# Patient Record
Sex: Male | Born: 1960 | Race: White | Hispanic: No | Marital: Married | Smoking: Current every day smoker
Health system: Southern US, Community
[De-identification: ages and names within clinical notes are randomized; demographics above are authoritative.]

## PROBLEM LIST (undated history)

## (undated) DIAGNOSIS — E785 Hyperlipidemia, unspecified: Secondary | ICD-10-CM

## (undated) DIAGNOSIS — I10 Essential (primary) hypertension: Secondary | ICD-10-CM

---

## 2010-07-02 ENCOUNTER — Emergency Department (HOSPITAL_COMMUNITY): Admission: EM | Admit: 2010-07-02 | Discharge: 2010-07-02 | Payer: Self-pay | Admitting: Emergency Medicine

## 2010-10-28 LAB — BASIC METABOLIC PANEL
Chloride: 104 mEq/L (ref 96–112)
GFR calc non Af Amer: >60 mL/min (ref >60)
Glucose, Bld: 98 mg/dL (ref 70–99)
Potassium: 3.8 mEq/L (ref 3.5–5.1)
Sodium: 137 mEq/L (ref 135–145)

## 2010-10-28 LAB — DIFFERENTIAL
Band Neutrophils: 0 % (ref 0–10)
Basophils Absolute: 0 10*3/uL (ref 0.0–0.1)
Basophils Relative: 0 % (ref 0–1)
Lymphocytes Relative: 40 % (ref 12–46)
Lymphs Abs: 4.5 10*3/uL — ABNORMAL HIGH (ref 0.7–4.0)
Monocytes Absolute: 0.8 10*3/uL (ref 0.1–1.0)
Monocytes Relative: 7 % (ref 3–12)

## 2010-10-28 LAB — CBC
HCT: 47.4 % (ref 39.0–52.0)
Hemoglobin: 16 g/dL (ref 13.0–17.0)
MCHC: 33.8 g/dL (ref 30.0–36.0)
MCV: 92.8 fL (ref 78.0–100.0)
WBC: 11.3 10*3/uL — ABNORMAL HIGH (ref 4.0–10.5)

## 2010-10-28 LAB — POCT CARDIAC MARKERS

## 2015-10-05 ENCOUNTER — Encounter (HOSPITAL_COMMUNITY): Payer: Self-pay

## 2015-10-05 ENCOUNTER — Emergency Department (HOSPITAL_COMMUNITY)
Admission: EM | Admit: 2015-10-05 | Discharge: 2015-10-05 | Disposition: A | Discharge status: Home / Self Care | Payer: Self-pay | Attending: Emergency Medicine | Admitting: Emergency Medicine

## 2015-10-05 ENCOUNTER — Emergency Department (HOSPITAL_COMMUNITY): Payer: Self-pay

## 2015-10-05 DIAGNOSIS — Z87891 Personal history of nicotine dependence: Secondary | ICD-10-CM | POA: Insufficient documentation

## 2015-10-05 DIAGNOSIS — M545 Low back pain, unspecified: Secondary | ICD-10-CM

## 2015-10-05 DIAGNOSIS — R45851 Suicidal ideations: Secondary | ICD-10-CM | POA: Insufficient documentation

## 2015-10-05 DIAGNOSIS — R0789 Other chest pain: Secondary | ICD-10-CM | POA: Insufficient documentation

## 2015-10-05 DIAGNOSIS — I10 Essential (primary) hypertension: Secondary | ICD-10-CM | POA: Insufficient documentation

## 2015-10-05 DIAGNOSIS — F439 Reaction to severe stress, unspecified: Secondary | ICD-10-CM | POA: Insufficient documentation

## 2015-10-05 DIAGNOSIS — Z8639 Personal history of other endocrine, nutritional and metabolic disease: Secondary | ICD-10-CM | POA: Insufficient documentation

## 2015-10-05 DIAGNOSIS — J018 Other acute sinusitis: Secondary | ICD-10-CM | POA: Insufficient documentation

## 2015-10-05 DIAGNOSIS — F430 Acute stress reaction: Secondary | ICD-10-CM

## 2015-10-05 DIAGNOSIS — F329 Major depressive disorder, single episode, unspecified: Secondary | ICD-10-CM | POA: Insufficient documentation

## 2015-10-05 DIAGNOSIS — F419 Anxiety disorder, unspecified: Secondary | ICD-10-CM | POA: Insufficient documentation

## 2015-10-05 DIAGNOSIS — F32A Depression, unspecified: Secondary | ICD-10-CM

## 2015-10-05 HISTORY — DX: Essential (primary) hypertension: I10

## 2015-10-05 HISTORY — DX: Hyperlipidemia, unspecified: E78.5

## 2015-10-05 LAB — COMPREHENSIVE METABOLIC PANEL
ALK PHOS: 89 U/L (ref 38–126)
ALT: 19 U/L (ref 17–63)
AST: 21 U/L (ref 15–41)
Albumin: 4.9 g/dL (ref 3.5–5.0)
Anion gap: 12 (ref 5–15)
BILIRUBIN TOTAL: 0.6 mg/dL (ref 0.3–1.2)
BUN: 11 mg/dL (ref 6–20)
CALCIUM: 9.7 mg/dL (ref 8.9–10.3)
CHLORIDE: 102 mmol/L (ref 101–111)
CO2: 21 mmol/L — ABNORMAL LOW (ref 22–32)
CREATININE: 0.86 mg/dL (ref 0.61–1.24)
GFR calc Af Amer: >60 mL/min (ref >60)
Glucose, Bld: 114 mg/dL — ABNORMAL HIGH (ref 65–99)
Potassium: 3.7 mmol/L (ref 3.5–5.1)
SODIUM: 135 mmol/L (ref 135–145)
Total Protein: 8.5 g/dL — ABNORMAL HIGH (ref 6.5–8.1)

## 2015-10-05 LAB — TROPONIN I

## 2015-10-05 LAB — CBC
HCT: 48.3 % (ref 39.0–52.0)
Hemoglobin: 17.5 g/dL — ABNORMAL HIGH (ref 13.0–17.0)
MCH: 32.6 pg (ref 26.0–34.0)
MCHC: 36.2 g/dL — ABNORMAL HIGH (ref 30.0–36.0)
MCV: 89.9 fL (ref 78.0–100.0)
PLATELETS: 290 10*3/uL (ref 150–400)
RBC: 5.37 MIL/uL (ref 4.22–5.81)
RDW: 12.7 % (ref 11.5–15.5)
WBC: 14.3 10*3/uL — AB (ref 4.0–10.5)

## 2015-10-05 LAB — ETHANOL: Alcohol, Ethyl (B): <5 mg/dL (ref <5)

## 2015-10-05 MED ORDER — IBUPROFEN 400 MG PO TABS
400.0000 mg | ORAL_TABLET | Freq: Once | ORAL | Status: AC
  Administered 2015-10-05: 400 mg via ORAL
  Filled 2015-10-05: qty 1

## 2015-10-05 MED ORDER — AMOXICILLIN 500 MG PO CAPS: 500.0000 mg | ORAL_CAPSULE | Freq: Three times a day (TID) | ORAL | Status: DC

## 2015-10-05 MED ORDER — HYDROCODONE-ACETAMINOPHEN 5-325 MG PO TABS
1.0000 | ORAL_TABLET | Freq: Once | ORAL | Status: AC
  Administered 2015-10-05: 1 via ORAL
  Filled 2015-10-05: qty 1

## 2015-10-05 MED ORDER — HYDROCODONE-ACETAMINOPHEN 5-325 MG PO TABS: 2.0000 | ORAL_TABLET | Freq: Once | ORAL | Status: DC

## 2015-10-05 MED ORDER — LORAZEPAM 1 MG PO TABS
1.0000 mg | ORAL_TABLET | Freq: Once | ORAL | Status: AC
  Administered 2015-10-05: 1 mg via ORAL
  Filled 2015-10-05: qty 1

## 2015-10-05 NOTE — BH Assessment (Addendum)
Tele Assessment Note   Kevin Jordan is an 55 y.o. male who came to the Emergency Department due to a sinus infection that has gotten worse. He states that he was "spitting up blood and passed out earlier" so he came to the Emergency Deparment. While he was here he told the RN and this Clinical research associate that his wife passed away 2015-10-19 and he has been depressed and not able to properly care for him self. He states that he has not been eating regularly since she passed and has only been getting 4 to 5 hours of sleep a night. 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 He states that he now lives alone and gets little help from his family. When asked if he is suicidal he states that he "has had thoughts since he was 45 but never acted on it". When asked if he was suicidal today he states "I have thought about it but I don't have a plan". He does not have a mental health provider and has never been admitted inpatient or been to outpatient services. He states that before he got married he drank alcohol frequently but hasn't in years. He only has one beer at a time now but admits that he had a pint of liquor one week ago after his wife passed. No HI or A/V hallucinations noted.   Diagnosis:309.0  Adjustment Disorder w/ depressed mood   Past Medical History:  Past Medical History  Diagnosis Date  . Hypertension   . Hyperlipemia     History reviewed. No pertinent past surgical history.  Family History: History reviewed. No pertinent family history.  Social History:  reports that he has quit smoking. He does not have any smokeless tobacco history on file. He reports that he does not drink alcohol or use illicit drugs.  Additional Social History:  Alcohol / Drug Use History of alcohol / drug use?: Yes Substance #1 Name of Substance 1: Some mild use of Alcohol   CIWA: CIWA-Ar BP: 138/85 mmHg Pulse Rate: 92 COWS:    PATIENT STRENGTHS: (choose at  least two) Average or above average intelligence Capable of independent living  Allergies: No Known Allergies  Home Medications:  (Not in a hospital admission)  OB/GYN Status:  No LMP for male patient.  General Assessment Data Location of Assessment: [facility] TTS Assessment: In system Is this a Tele or Face-to-Face Assessment?: Tele Assessment Is this an Initial Assessment or a Re-assessment for this encounter?: Initial Assessment Marital status: Widowed Living Arrangements: Alone Can pt return to current living arrangement?: Yes Admission Status: Voluntary Is patient capable of signing voluntary admission?: Yes Referral Source: Self/Family/Friend Insurance type:  (None)     Crisis Care Plan Living Arrangements: Alone  Education Status Is patient currently in school?: No Highest grade of school patient has completed: 8th  Risk to self with the past 6 months Suicidal Ideation: No (Some passive SI thoughts no plan ) Has patient been a risk to self within the past 6 months prior to admission? : No Suicidal Intent: No Has patient had any suicidal intent within the past 6 months prior to admission? : No Is patient at risk for suicide?: No Suicidal Plan?: No Has patient had any suicidal plan within the past 6 months prior to admission? : No Access to Means: No What has been your use of drugs/alcohol within the last 12 months?:  (using some alcohol- very little) Previous  Attempts/Gestures: No How many times?: 0 Other Self Harm Risks: none Triggers for Past Attempts: None known Intentional Self Injurious Behavior: None Family Suicide History: Unknown Recent stressful life event(s): Loss (Comment) (wife passed away September 19, 2022) Persecutory voices/beliefs?: No Depression: Yes Depression Symptoms: Despondent, Insomnia, Tearfulness, Isolating, Loss of interest in usual pleasures Substance abuse history and/or treatment for substance abuse?: No Suicide prevention information  given to non-admitted patients: Not applicable  Risk to Others within the past 6 months Homicidal Ideation: No Does patient have any lifetime risk of violence toward others beyond the six months prior to admission? : No Thoughts of Harm to Others: No Current Homicidal Intent: No Current Homicidal Plan: No Access to Homicidal Means: No Identified Victim: none History of harm to others?: No Assessment of Violence: None Noted Violent Behavior Description: none Does patient have access to weapons?: No Criminal Charges Pending?: No Does patient have a court date: No Is patient on probation?: No  Psychosis Hallucinations: None noted Delusions: None noted  Mental Status Report Appearance/Hygiene: In scrubs Eye Contact: Fair Motor Activity: Freedom of movement Speech: Logical/coherent Level of Consciousness: Alert Mood: Depressed Affect: Blunted, Depressed Anxiety Level: Moderate Thought Processes: Coherent Judgement: Unimpaired Orientation: Person, Place, Time, Situation Obsessive Compulsive Thoughts/Behaviors: None  Cognitive Functioning Concentration: Normal Memory: Recent Intact, Remote Intact IQ: Average Insight: Fair Impulse Control: Fair Appetite: Poor Weight Loss: 30 Weight Gain: 0 Sleep: Decreased Total Hours of Sleep: 4 Vegetative Symptoms: None  ADLScreening [facility] Assessment Services) Patient's cognitive ability adequate to safely complete daily activities?: Yes Patient able to express need for assistance with ADLs?: Yes Independently performs ADLs?: Yes (appropriate for developmental age)  Prior Inpatient Therapy Prior Inpatient Therapy: No  Prior Outpatient Therapy Prior Outpatient Therapy: No Does patient have an ACCT team?: No Does patient have Intensive In-House Services?  : No Does patient have [facility] services? : No Does patient have P4CC services?: No  ADL Screening (condition at time of admission) Patient's cognitive ability adequate to  safely complete daily activities?: Yes Is the patient deaf or have difficulty hearing?: No Does the patient have difficulty seeing, even when wearing glasses/contacts?: No Does the patient have difficulty concentrating, remembering, or making decisions?: No Patient able to express need for assistance with ADLs?: Yes Does the patient have difficulty dressing or bathing?: No Independently performs ADLs?: Yes (appropriate for developmental age) Does the patient have difficulty walking or climbing stairs?: No Weakness of Legs: None Weakness of Arms/Hands: None  Home Assistive Devices/Equipment Home Assistive Devices/Equipment: None  Therapy Consults (therapy consults require a physician order) PT Evaluation Needed: No OT Evalulation Needed: No SLP Evaluation Needed: No Abuse/Neglect Assessment (Assessment to be complete while patient is alone) Physical Abuse: Denies Verbal Abuse: Denies Sexual Abuse: Denies Exploitation of patient/patient's resources: Denies Self-Neglect: Yes, present (Comment) (not taking care of self- not eating, sleeping) Values / Beliefs Cultural Requests During Hospitalization: None Spiritual Requests During Hospitalization: None Consults Spiritual Care Consult Needed: No Social Work Consult Needed: No Merchant navy officer (For Healthcare) Does patient have an advance directive?: No Would patient like information on creating an advanced directive?: No - patient declined information    Additional Information 1:1 In Past 12 Months?: No CIRT Risk: No Elopement Risk: No Does patient have medical clearance?: No     Disposition:     Sidonia Nutter 10/05/2015 5:30 PM

## 2015-10-05 NOTE — ED Notes (Signed)
When asked if patient has had any suicidal thoughts he stated "yes in the past and today". Patient states he has been completing suicide since his wife passed away on 10-14-15. Patient states he has access to pills and he plans on taking those with alcohol.

## 2015-10-05 NOTE — ED Notes (Signed)
Patient states when he blew his nose today it was bloody, he began to feel dizzy and passed out from a standing position. Does not think he hit his head. Denies pain and vision symptoms. States he thinks he was unconscience for about 1 min. Patient also c/o of lower back pain from an injury a few months ago.

## 2015-10-05 NOTE — Discharge Instructions (Signed)
It was our pleasure to provide your ER care today - we hope that you feel better.  We are very sorry for your recent loss.   For sinus symptoms, take amoxicillin as prescribed.  You may try over the counter decongestant such as mucinex as need for symptom relief.  For back pain, try heat therapy.  You may also take motrin or aleve as need for pain (available over the counter).  For recent stressors/anxiety/depression, follow up with primary care doctor, as well as [facility] - see resource guide as well.  Return to ER right away if worse, high fevers, recurrent or persistent chest pain, trouble breathing, other concern.  You were given medication in the ER - no driving for the next 4 hours.    Major Depressive Disorder Major depressive disorder is a mental illness. It also may be called clinical depression or unipolar depression. Major depressive disorder usually causes feelings of sadness, hopelessness, or helplessness. Some people with this disorder do not feel particularly sad but lose interest in doing things they used to enjoy (anhedonia). Major depressive disorder also can cause physical symptoms. It can interfere with work, school, relationships, and other normal everyday activities. The disorder varies in severity but is longer lasting and more serious than the sadness we all feel from time to time in our lives. Major depressive disorder often is triggered by stressful life events or major life changes. Examples of these triggers include divorce, loss of your job or home, a move, and the death of a family member or close friend. Sometimes this disorder occurs for no obvious reason at all. People who have family members with major depressive disorder or bipolar disorder are at higher risk for developing this disorder, with or without life stressors. Major depressive disorder can occur at any age. It may occur just once in your life (single episode major depressive disorder). It may occur  multiple times (recurrent major depressive disorder). SYMPTOMS People with major depressive disorder have either anhedonia or depressed mood on nearly a daily basis for at least 2 weeks or longer. Symptoms of depressed mood include:  Feelings of sadness (blue or down in the dumps) or emptiness.  Feelings of hopelessness or helplessness.  Tearfulness or episodes of crying (may be observed by others).  Irritability (children and adolescents). In addition to depressed mood or anhedonia or both, people with this disorder have at least four of the following symptoms:  Difficulty sleeping or sleeping too much.   Significant change (increase or decrease) in appetite or weight.   Lack of energy or motivation.  Feelings of guilt and worthlessness.   Difficulty concentrating, remembering, or making decisions.  Unusually slow movement (psychomotor retardation) or restlessness (as observed by others).   Recurrent wishes for death, recurrent thoughts of self-harm (suicide), or a suicide attempt. People with major depressive disorder commonly have persistent negative thoughts about themselves, other people, and the world. People with severe major depressive disorder may experiencedistorted beliefs or perceptions about the world (psychotic delusions). They also may see or hear things that are not real (psychotic hallucinations). DIAGNOSIS Major depressive disorder is diagnosed through an assessment by your health care provider. Your health care provider will ask aboutaspects of your daily life, such as mood,sleep, and appetite, to see if you have the diagnostic symptoms of major depressive disorder. Your health care provider may ask about your medical history and use of alcohol or drugs, including prescription medicines. Your health care provider also may do a physical  exam and blood work. This is because certain medical conditions and the use of certain substances can cause major depressive  disorder-like symptoms (secondary depression). Your health care provider also may refer you to a mental health specialist for further evaluation and treatment. TREATMENT It is important to recognize the symptoms of major depressive disorder and seek treatment. The following treatments can be prescribed for this disorder:   Medicine. Antidepressant medicines usually are prescribed. Antidepressant medicines are thought to correct chemical imbalances in the brain that are commonly associated with major depressive disorder. Other types of medicine may be added if the symptoms do not respond to antidepressant medicines alone or if psychotic delusions or hallucinations occur.  Talk therapy. Talk therapy can be helpful in treating major depressive disorder by providing support, education, and guidance. Certain types of talk therapy also can help with negative thinking (cognitive behavioral therapy) and with relationship issues that trigger this disorder (interpersonal therapy). A mental health specialist can help determine which treatment is best for you. Most people with major depressive disorder do well with a combination of medicine and talk therapy. Treatments involving electrical stimulation of the brain can be used in situations with extremely severe symptoms or when medicine and talk therapy do not work over time. These treatments include electroconvulsive therapy, transcranial magnetic stimulation, and vagal nerve stimulation.   This information is not intended to replace advice given to you by your health care provider. Make sure you discuss any questions you have with your health care provider.   Document Released: 11/28/2012 Document Revised: 08/24/2014 Document Reviewed: 11/28/2012 Elsevier Interactive Patient Education 2016 [employer].  Suicidal Feelings: How to Help Yourself Suicide is the taking of one's own life. If you feel as though life is getting too tough to handle and are thinking  about suicide, get help right away. To get help:  Call your local emergency services (911 in the [place]  Call a suicide hotline to speak with a trained counselor who understands how you are feeling. The following is a list of suicide hotlines in the [place]. For a list of hotlines in [place], visit InkDistributor.it.  1-800-273-TALK 814-412-4553).  1-800-SUICIDE 346-597-4253).  305 646 9076. This is a hotline for Spanish speakers.  4-696-295-2WUX 3072818377). This is a hotline for TTY users.  1-866-4-U-TREVOR 954-124-0874). This is a hotline for lesbian, gay, bisexual, transgender, or questioning youth.  Contact a crisis center or a local suicide prevention center. To find a crisis center or suicide prevention center:  Call your local hospital, clinic, community service organization, mental health center, social service provider, or health department. Ask for assistance in connecting to a crisis center.  Visit https://www.patel-king.com/ for a list of crisis centers in the [place], or visit www.suicideprevention.ca/thinking-about-suicide/find-a-crisis-centre for a list of centers in [place].  Visit the following websites:  National Suicide Prevention Lifeline: www.suicidepreventionlifeline.org  Hopeline: www.hopeline.com  [employer] for Suicide Prevention: https://www.ayers.com/  [employer] (for lesbian, gay, bisexual, transgender, or questioning youth): www.thetrevorproject.org HOW CAN I HELP MYSELF FEEL BETTER?  Promise yourself that you will not do anything drastic when you have suicidal feelings. Remember, there is hope. Many people have gotten through suicidal thoughts and feelings, and you will, too. You may have gotten through them before, and this proves that you can get through them again.  Let family, friends, teachers, or counselors know how you are  feeling. Try not to isolate yourself from those who care about you. Remember, they will want to help you. Talk with someone  every day, even if you do not feel sociable. Face-to-face conversation is best.  Call a mental health professional and see one regularly.  Visit your primary health care provider every year.  Eat a well-balanced diet, and space your meals so you eat regularly.  Get plenty of rest.  Avoid alcohol and drugs, and remove them from your home. They will only make you feel worse.  If you are thinking of taking a lot of medicine, give your medicine to someone who can give it to you one day at a time. If you are on antidepressants and are concerned you will overdose, let your health care provider know so he or she can give you safer medicines. Ask your mental health professional about the possible side effects of any medicines you are taking.  Remove weapons, poisons, knives, and anything else that could harm you from your home.  Try to stick to routines. Follow a schedule every day. Put self-care on your schedule.  Make a list of realistic goals, and cross them off when you achieve them. Accomplishments give a sense of worth.  Wait until you are feeling better before doing the things you find difficult or unpleasant.  Exercise if you are able. You will feel better if you exercise for even a half hour each day.  Go out in the sun or into nature. This will help you recover from depression faster. If you have a favorite place to walk, go there.  Do the things that have always given you pleasure. Play your favorite music, read a good book, paint a picture, play your favorite instrument, or do anything else that takes your mind off your depression if it is safe to do.  Keep your living space well lit.  When you are feeling well, write yourself a letter about tips and support that you can read when you are not feeling well.  Remember that life's difficulties can be sorted out  with help. Conditions can be treated. You can work on thoughts and strategies that serve you well.   This information is not intended to replace advice given to you by your health care provider. Make sure you discuss any questions you have with your health care provider.   Document Released: 02/07/2003 Document Revised: 08/24/2014 Document Reviewed: 11/28/2013 Elsevier Interactive Patient Education 2016 Elsevier Inc.    Sinusitis, Adult Sinusitis is redness, soreness, and inflammation of the paranasal sinuses. Paranasal sinuses are air pockets within the bones of your face. They are located beneath your eyes, in the middle of your forehead, and above your eyes. In healthy paranasal sinuses, mucus is able to drain out, and air is able to circulate through them by way of your nose. However, when your paranasal sinuses are inflamed, mucus and air can become trapped. This can allow bacteria and other germs to grow and cause infection. Sinusitis can develop quickly and last only a short time (acute) or continue over a long period (chronic). Sinusitis that lasts for more than 12 weeks is considered chronic. CAUSES Causes of sinusitis include:  Allergies.  Structural abnormalities, such as displacement of the cartilage that separates your nostrils (deviated septum), which can decrease the air flow through your nose and sinuses and affect sinus drainage.  Functional abnormalities, such as when the small hairs (cilia) that line your sinuses and help remove mucus do not work properly or are not present. SIGNS AND SYMPTOMS Symptoms of acute and chronic sinusitis are the same. The primary symptoms are pain  and pressure around the affected sinuses. Other symptoms include:  Upper toothache.  Earache.  Headache.  Bad breath.  Decreased sense of smell and taste.  A cough, which worsens when you are lying flat.  Fatigue.  Fever.  Thick drainage from your nose, which often is green and may  contain pus (purulent).  Swelling and warmth over the affected sinuses. DIAGNOSIS Your health care provider will perform a physical exam. During your exam, your health care provider may perform any of the following to help determine if you have acute sinusitis or chronic sinusitis:  Look in your nose for signs of abnormal growths in your nostrils (nasal polyps).  Tap over the affected sinus to check for signs of infection.  View the inside of your sinuses using an imaging device that has a light attached (endoscope). If your health care provider suspects that you have chronic sinusitis, one or more of the following tests may be recommended:  Allergy tests.  Nasal culture. A sample of mucus is taken from your nose, sent to a lab, and screened for bacteria.  Nasal cytology. A sample of mucus is taken from your nose and examined by your health care provider to determine if your sinusitis is related to an allergy. TREATMENT Most cases of acute sinusitis are related to a viral infection and will resolve on their own within 10 days. Sometimes, medicines are prescribed to help relieve symptoms of both acute and chronic sinusitis. These may include pain medicines, decongestants, nasal steroid sprays, or saline sprays. However, for sinusitis related to a bacterial infection, your health care provider will prescribe antibiotic medicines. These are medicines that will help kill the bacteria causing the infection. Rarely, sinusitis is caused by a fungal infection. In these cases, your health care provider will prescribe antifungal medicine. For some cases of chronic sinusitis, surgery is needed. Generally, these are cases in which sinusitis recurs more than 3 times per year, despite other treatments. HOME CARE INSTRUCTIONS  Drink plenty of water. Water helps thin the mucus so your sinuses can drain more easily.  Use a humidifier.  Inhale steam 3-4 times a day (for example, sit in the bathroom with  the shower running).  Apply a warm, moist washcloth to your face 3-4 times a day, or as directed by your health care provider.  Use saline nasal sprays to help moisten and clean your sinuses.  Take medicines only as directed by your health care provider.  If you were prescribed either an antibiotic or antifungal medicine, finish it all even if you start to feel better. SEEK IMMEDIATE MEDICAL CARE IF:  You have increasing pain or severe headaches.  You have nausea, vomiting, or drowsiness.  You have swelling around your face.  You have vision problems.  You have a stiff neck.  You have difficulty breathing.   This information is not intended to replace advice given to you by your health care provider. Make sure you discuss any questions you have with your health care provider.   Document Released: 08/03/2005 Document Revised: 08/24/2014 Document Reviewed: 08/18/2011 Elsevier Interactive Patient Education 2016 Elsevier Inc.     Back Pain, Adult Back pain is very common in adults.The cause of back pain is rarely dangerous and the pain often gets better over time.The cause of your back pain may not be known. Some common causes of back pain include:  Strain of the muscles or ligaments supporting the spine.  Wear and tear (degeneration) of the spinal disks.  Arthritis.  Direct injury to the back. For many people, back pain may return. Since back pain is rarely dangerous, most people can learn to manage this condition on their own. HOME CARE INSTRUCTIONS Watch your back pain for any changes. The following actions may help to lessen any discomfort you are feeling:  Remain active. It is stressful on your back to sit or stand in one place for long periods of time. Do not sit, drive, or stand in one place for more than 30 minutes at a time. Take short walks on even surfaces as soon as you are able.Try to increase the length of time you walk each day.  Exercise regularly as  directed by your health care provider. Exercise helps your back heal faster. It also helps avoid future injury by keeping your muscles strong and flexible.  Do not stay in bed.Resting more than 1-2 days can delay your recovery.  Pay attention to your body when you bend and lift. The most comfortable positions are those that put less stress on your recovering back. Always use proper lifting techniques, including:  Bending your knees.  Keeping the load close to your body.  Avoiding twisting.  Find a comfortable position to sleep. Use a firm mattress and lie on your side with your knees slightly bent. If you lie on your back, put a pillow under your knees.  Avoid feeling anxious or stressed.Stress increases muscle tension and can worsen back pain.It is important to recognize when you are anxious or stressed and learn ways to manage it, such as with exercise.  Take medicines only as directed by your health care provider. Over-the-counter medicines to reduce pain and inflammation are often the most helpful.Your health care provider may prescribe muscle relaxant drugs.These medicines help dull your pain so you can more quickly return to your normal activities and healthy exercise.  Apply ice to the injured area:  Put ice in a plastic bag.  Place a towel between your skin and the bag.  Leave the ice on for 20 minutes, 2-3 times a day for the first 2-3 days. After that, ice and heat may be alternated to reduce pain and spasms.  Maintain a healthy weight. Excess weight puts extra stress on your back and makes it difficult to maintain good posture. SEEK MEDICAL CARE IF:  You have pain that is not relieved with rest or medicine.  You have increasing pain going down into the legs or buttocks.  You have pain that does not improve in one week.  You have night pain.  You lose weight.  You have a fever or chills. SEEK IMMEDIATE MEDICAL CARE IF:   You develop new bowel or bladder  control problems.  You have unusual weakness or numbness in your arms or legs.  You develop nausea or vomiting.  You develop abdominal pain.  You feel faint.   This information is not intended to replace advice given to you by your health care provider. Make sure you discuss any questions you have with your health care provider.   Document Released: 08/03/2005 Document Revised: 08/24/2014 Document Reviewed: 12/05/2013 Elsevier Interactive Patient Education 2016 Elsevier Inc.    [employer] Therapy Heat therapy can help ease sore, stiff, injured, and tight muscles and joints. Heat relaxes your muscles, which may help ease your pain.  RISKS AND COMPLICATIONS If you have any of the following conditions, do not use heat therapy unless your health care provider has approved:  Poor circulation.  Healing wounds or scarred skin  in the area being treated.  Diabetes, heart disease, or high blood pressure.  Not being able to feel (numbness) the area being treated.  Unusual swelling of the area being treated.  Active infections.  Blood clots.  Cancer.  Inability to communicate pain. This may include young children and people who have problems with their brain function (dementia).  Pregnancy. Heat therapy should only be used on old, pre-existing, or long-lasting (chronic) injuries. Do not use heat therapy on new injuries unless directed by your health care provider. HOW TO USE HEAT THERAPY There are several different kinds of heat therapy, including:  Moist heat pack.  Warm water bath.  Hot water bottle.  Electric heating pad.  Heated gel pack.  Heated wrap.  Electric heating pad. Use the heat therapy method suggested by your health care provider. Follow your health care provider's instructions on when and how to use heat therapy. GENERAL HEAT THERAPY RECOMMENDATIONS  Do not sleep while using heat therapy. Only use heat therapy while you are awake.  Your skin may turn pink  while using heat therapy. Do not use heat therapy if your skin turns red.  Do not use heat therapy if you have new pain.  High heat or long exposure to heat can cause burns. Be careful when using heat therapy to avoid burning your skin.  Do not use heat therapy on areas of your skin that are already irritated, such as with a rash or sunburn. SEEK MEDICAL CARE IF:  You have blisters, redness, swelling, or numbness.  You have new pain.  Your pain is worse. MAKE SURE YOU:  Understand these instructions.  Will watch your condition.  Will get help right away if you are not doing well or get worse.   This information is not intended to replace advice given to you by your health care provider. Make sure you discuss any questions you have with your health care provider.   Document Released: 10/26/2011 Document Revised: 08/24/2014 Document Reviewed: 09/26/2013 Elsevier Interactive Patient Education 2016 [employer].   Smoking Hazards Smoking cigarettes is extremely bad for your health. Tobacco smoke has over 200 known poisons in it. It contains the poisonous gases nitrogen oxide and carbon monoxide. There are over 60 chemicals in tobacco smoke that cause cancer. Some of the chemicals found in cigarette smoke include:   Cyanide.   Benzene.   Formaldehyde.   Methanol (wood alcohol).   Acetylene (fuel used in welding torches).   Ammonia.  Even smoking lightly shortens your life expectancy by several years. You can greatly reduce the risk of medical problems for you and your family by stopping now. Smoking is the most preventable cause of death and disease in our society. Within days of quitting smoking, your circulation improves, you decrease the risk of having a heart attack, and your lung capacity improves. There may be some increased phlegm in the first few days after quitting, and it may take months for your lungs to clear up completely. Quitting for 10 years reduces your  risk of developing lung cancer to almost that of a nonsmoker.  WHAT ARE THE RISKS OF SMOKING? Cigarette smokers have an increased risk of many serious medical problems, including:  Lung cancer.   Lung disease (such as pneumonia, bronchitis, and emphysema).   Heart attack and chest pain due to the heart not getting enough oxygen (angina).   Heart disease and peripheral blood vessel disease.   Hypertension.   Stroke.   Oral cancer (cancer  of the lip, mouth, or voice box).   Bladder cancer.   Pancreatic cancer.   Cervical cancer.   Pregnancy complications, including premature birth.   Stillbirths and smaller newborn babies, birth defects, and genetic damage to sperm.   Early menopause.   Lower estrogen level for women.   Infertility.   Facial wrinkles.   Blindness.   Increased risk of broken bones (fractures).   Senile dementia.   Stomach ulcers and internal bleeding.   Delayed wound healing and increased risk of complications during surgery. Because of secondhand smoke exposure, children of smokers have an increased risk of the following:   Sudden infant death syndrome (SIDS).   Respiratory infections.   Lung cancer.   Heart disease.   Ear infections.  WHY IS SMOKING ADDICTIVE? Nicotine is the chemical agent in tobacco that is capable of causing addiction or dependence. When you smoke and inhale, nicotine is absorbed rapidly into the bloodstream through your lungs. Both inhaled and noninhaled nicotine may be addictive.  WHAT ARE THE BENEFITS OF QUITTING?  There are many health benefits to quitting smoking. Some are:   The likelihood of developing cancer and heart disease decreases. Health improvements are seen almost immediately.   Blood pressure, pulse rate, and breathing patterns start returning to normal soon after quitting.   People who quit may see an improvement in their overall quality of life.  HOW DO YOU QUIT  SMOKING? Smoking is an addiction with both physical and psychological effects, and longtime habits can be hard to change. Your health care provider can recommend:  Programs and community resources, which may include group support, education, or therapy.  Replacement products, such as patches, gum, and nasal sprays. Use these products only as directed. Do not replace cigarette smoking with electronic cigarettes (commonly called e-cigarettes). The safety of e-cigarettes is unknown, and some may contain harmful chemicals. FOR MORE INFORMATION  American Lung Association: www.lung.org  American Cancer Society: www.cancer.org   This information is not intended to replace advice given to you by your health care provider. Make sure you discuss any questions you have with your health care provider.   Document Released: 09/10/2004 Document Revised: 05/24/2013 Document Reviewed: 01/23/2013 Elsevier Interactive Patient Education 2016 [employer].     Emergency Department Resource Guide 1) Find a Doctor and Pay Out of Pocket Although you won't have to find out who is covered by your insurance plan, it is a good idea to ask around and get recommendations. You will then need to call the office and see if the doctor you have chosen will accept you as a new patient and what types of options they offer for patients who are self-pay. Some doctors offer discounts or will set up payment plans for their patients who do not have insurance, but you will need to ask so you aren't surprised when you get to your appointment.  2) Contact Your Local Health Department Not all health departments have doctors that can see patients for sick visits, but many do, so it is worth a call to see if yours does. If you don't know where your local health department is, you can check in your phone book. The CDC also has a tool to help you locate your state's health department, and many state websites also have listings of all of  their local health departments.  3) Find a Walk-in Clinic If your illness is not likely to be very severe or complicated, you may want to try a walk  in clinic. These are popping up all over the country in pharmacies, drugstores, and shopping centers. They're usually staffed by nurse practitioners or physician assistants that have been trained to treat common illnesses and complaints. They're usually fairly quick and inexpensive. However, if you have serious medical issues or chronic medical problems, these are probably not your best option.  No Primary Care Doctor: - Call Health Connect at  [phone] - they can help you locate a primary care doctor that  accepts your insurance, provides certain services, etc. - Physician Referral Service- [phone]  Chronic Pain Problems: Organization         Address  Phone   Notes  [facility]  [phone] Patients need to be referred by their primary care doctor.   Medication Assistance: Organization         Address  Phone   Notes  [facility] [address] [place] [place], [place] [address] [phone] --Must be a resident of [facility] -- Must have NO insurance coverage whatsoever (no Medicaid/ Medicare, etc.) -- The pt. MUST have a primary care doctor that directs their care regularly and follows them in the community   [facility]  [phone]   [employer]  [phone]    Agencies that provide inexpensive medical care: Organization         Address  Phone   Notes  [facility]  [phone]   [facility]    [phone]   [facility] [address] [place], [place] [address] [phone]   [facility] [address] [place], [place] [phone]   Planned Parenthood    [phone]   [facility]    [phone]   [facility]  [address],  [place] Phone:  [phone], Fax:  [phone] Hours of Operation:  9 am - 6 pm, M-F.  Also accepts Medicaid/Medicare and self-pay.  [facility]  [address], [place] Phone: [phone], Fax: [phone]. Hours of Operation:  8:30 am - 5:30 pm, M-F.  Also accepts Medicaid and self-pay.  [facility] [place] [address], [place] Phone: [phone]   [facility] [address] Natasha Bence [place], [place] [phone], Ext. [phone] Mondays & Thursdays: 7-9 AM.  First 15 patients are seen on a first come, first serve basis.    Medicaid-accepting [facility] Providers:  Organization         Address  Phone   Notes  [facility] [address], [place] [phone] Also accepts self-pay patients.  [facility] [address] Laurell Josephs [place], [place]  [phone]   [facility] [address], [place] [phone]   [facility] [address] [place] [phone]   Renaye Rakers [address], [place]   [phone] Only accepts [place] Access [place] patients after they have their name applied to their card.   Self-Pay (no insurance) in [facility]:  Organization         Address  Phone   Notes  Sickle Cell Patients, [facility] [address] [place], [place] [phone]   [facility] [address] [place], [place] [phone]   [facility] [place]  [address], [place] [phone]   [facility]/Dr. Osei-Bonsu  [address], [place] or [address], [place] [phone] Phone number for both [place] and [place] locations is the same.  [facility] [address], [place] [phone]   [facility] [address], [place] or [address] [phone]   [facility] [address], [place] [phone], phone; [phone], fax Sees patients 1st and 3rd Saturday of every month.  Must not qualify for public or private insurance (i.e. Medicaid, Medicare, [place] Health Choice, Veterans' Benefits)  Household income should be no more than 200% of the poverty level The clinic cannot treat you if you are pregnant or think you are pregnant  Sexually transmitted diseases are not treated at the clinic.    Dental Care: Organization         Address  Phone  Notes  [facility] Department of [facility] [address] [place], [place] [phone] Accepts children up to age 76 who are enrolled in [place] or [place] Health Choice; pregnant women with a Medicaid card; and children who have applied for Medicaid or [place] Health Choice, but were declined, whose parents can pay a reduced fee at time of service.  [facility]  [address], [place] [phone] Accepts children up to age 51 who are enrolled in [place] or [place] Health Choice; pregnant women with a Medicaid card; and children who have applied for Medicaid or [place] Health Choice, but were declined, whose parents can pay a reduced fee at time of service.  [facility]  [address] [place], [place] [phone] Patients are seen by appointment only. Walk-ins are not accepted. [facility] will see patients 58 years of age and older. Monday - Tuesday (8am-5pm) Most Wednesdays (8:30-5pm) $30 per visit, cash only  [facility] Adult [facility] Access [facility]  [address], [facility] [phone] Patients are seen by appointment only. Walk-ins are not accepted. [facility] will see patients 51 years of age and older. One Wednesday Evening (Monthly: Volunteer Based).  $30 per visit, cash only  [employer]   [phone] for adults; Children under age 50, call Graduate Pediatric Dentistry at [phone]. Children aged 51-14, please call [phone] to request a pediatric application.  Dental services are provided in all areas of dental care including fillings, crowns and bridges, complete and partial dentures, implants, gum treatment, root canals, and extractions. Preventive care is also provided. Treatment is provided to both adults and children. Patients are selected via a lottery and there is often a waiting list.   [facility] [address] [place]  [phone] [email or web address]   [facility] [address] [place], [place] [phone], Ext. [phone] Second and Fourth Thursday of each month, opens at 6:30 AM; Clinic ends at 9 AM.  Patients are seen on a first-come first-served basis, and a limited number are seen during each clinic.   [facility]  [address] Ether Griffins [place], [place] [phone]   Eligibility Requirements You must have lived in [place], [place], or [place] counties for at least the last three months.   You cannot be eligible for state or federal sponsored [place], including  [employer], [place], or [employer].   You generally cannot be eligible for healthcare insurance through your employer.    How to apply: Eligibility screenings are held every Tuesday and Wednesday afternoon from 1:00 pm until 4:00 pm. You do not need an appointment for the interview!  [facility] [address] [place], [place] [phone]   [facility] Health Department  [phone]   [facility] Health Department  [phone]   [facility] Health Department  [phone]    Behavioral Health Resources in the Community: Intensive Outpatient Programs Organization         Address  Phone  Notes  [facility] [address] [place], [place] [phone]   [facility] Outpatient [address], [place], [place] [phone]   [facility] [address], [place], [place]  [phone]   [facility] [address],  [place], [place] [phone] or [phone]   Substance Abuse Resources Organization         Address  Phone  Notes  [facility]  [phone]   [facility]  [phone]   [facility]  [phone]   Floydene Flock  [phone]   Residential & Outpatient Substance Abuse Program  [phone]   Psychological Services Organization         Address  Phone  Notes  [facility]  [phone]   [facility]  [phone]   [facility] Mental Health [address], [place] [phone] or [phone]    Mobile Crisis Teams Organization         Address  Phone  Notes  [facility], Mobile Crisis Care Unit  [phone]   [facility]  [address] [place], [place] [phone]   Doristine Locks [address] [place] [phone]    Self-Help/Support Groups Organization         Address  Phone             Notes  [facility] [place] - variety of support groups  336- I7437963 Call for more information  Narcotics Anonymous (NA), [facility] [address], [employer] [place]  2 meetings at this location   Statistician         Address  Phone  Notes  [facility] [address],    [place]  [phone]   [facility]  [address], [place] [address], [place], [place] [phone]   [facility] [address] [place], [place] [phone] Admissions: 8am-3pm M-F  [facility] [address],    [place], [place] [phone]   [facility] [address] [place], [place], [place] [phone]   [facility] [address],  [place], [place] [phone]     [facility] - Intensive Outpatient [address] Laurell Joseph[address], [place], [place] [phone]   [facility] ([facility] [address] [place]  [place], [place] [phone] or [phone]   [facility] ([facility]) [address], [place], [place] [phone] Accepts Medicaid  [facility] [place] [address]  [place] [phone] Substance Abuse/Addiction Treatment   [facility] Organization         Address  Phone  Notes  [facility]  [phone]   Angie Fava, PhD [address] [place], [place]   ([phone] or [phone]) [phone])  [phone]   °[facility]   [address] °[place], [place] ([phone]) [phone]   °[facility] [address], [place], [place] [phone]) [phone] Insurance/Medicaid/sponsorship through [facility]  °[facility] [address]                                    [place], [place] [phone]) [phone] Therapy/tele-psych/case  °[facility] [address]  ° [place], [place] [phone]    °Dr. Arfeen  ([phone]   °[facility] [place]  [employer] [place] [facility] 1) [address], [place] °2) [address], [place] °3)  [address], [place] [phone]) [phone] °([phone]) [phone] ° °([phone]) [phone]   °[place] Child Abuse Hotline (336) 342-1394 or (336) [phone] (After Hours)    ° ° ° °

## 2015-10-05 NOTE — ED Notes (Signed)
Pt changed into paper scrubs, clothes, shoes socks, lighter, cigarettes, glasses locked in locker.  Security locking up keys and money.  Security at bedside to wand pt.

## 2015-10-05 NOTE — ED Provider Notes (Addendum)
CSN: [id number]     Arrival date & time 10/05/15  1555 History   First MD Initiated Contact with Patient 10/05/15 1646     Chief Complaint  Patient presents with  . V70.1     (Consider location/radiation/quality/duration/timing/severity/associated sxs/prior Treatment) The history is provided by the patient.  Patient c/o 'low back pain' and sinus congestion.  States sinus symptoms first started a few weeks ago with nasal congestion, post nasal drip.  Acute onset. Constant. Moderate. States now has sinus congestion, drainage, and sinus pain. Subjective fever.  No sore throat, no cough. No chills.  Pt states strained low back 1 month ago, pain persists. No acute or abrupt change today or this week. No radicular pain. No numbness/weakness.   Pt also states very depressed since wife died 2 weeks ago.  +suicidal thoughts although states he knows he would never harm self, and denies any plan to harm self or any prior attempt.  States had depression previously, x years. No current rx for same. Denies etoh or substance abuse.  Pt also indicates 'chest pain' for the past couple days. At rest. Mid chest. Constant. No exertional cp or discomfort. No unusual doe or fatigue. No pleuritic pain. No associated nv or diaphoresis.  Denies leg pain or swelling. No fam hx cad.      Past Medical History  Diagnosis Date  . Hypertension   . Hyperlipemia    History reviewed. No pertinent past surgical history. History reviewed. No pertinent family history. Social History  Substance Use Topics  . Smoking status: Former Games developer  . Smokeless tobacco: None  . Alcohol Use: No    Review of Systems  Constitutional: Negative for fever and chills.  HENT: Positive for congestion, rhinorrhea and sinus pressure. Negative for sore throat.   Eyes: Negative for redness.  Respiratory: Negative for cough and shortness of breath.   Cardiovascular: Negative for chest pain.  Gastrointestinal: Negative for vomiting and  abdominal pain.  Genitourinary: Negative for flank pain.  Musculoskeletal: Negative for back pain and neck pain.  Skin: Negative for rash.  Neurological: Negative for headaches.  Hematological: Does not bruise/bleed easily.  Psychiatric/Behavioral: Positive for dysphoric mood.      Allergies  Review of patient's allergies indicates no known allergies.  Home Medications   Prior to Admission medications   Not on File   BP 138/85 mmHg  Pulse 92  Temp(Src) 97.6 F (36.4 C) (Oral)  Resp 18  Ht  (1.727 m)  Wt 65.635 kg  BMI 22.01 kg/m2  SpO2 97% Physical Exam  Constitutional: He is oriented to person, place, and time. He appears well-developed and well-nourished. No distress.  HENT:  Mouth/Throat: Oropharynx is clear and moist.  Nasal congestion.  +sinus pain/tenderness.   Eyes: Conjunctivae are normal. Pupils are equal, round, and reactive to light.  Neck: Neck supple. No tracheal deviation present.  Cardiovascular: Normal rate, regular rhythm, normal heart sounds and intact distal pulses.  Exam reveals no gallop and no friction rub.   No murmur heard. Pulmonary/Chest: Effort normal and breath sounds normal. No accessory muscle usage. No respiratory distress. He exhibits tenderness.  Mild chest wall tenderness.   Abdominal: Soft. Bowel sounds are normal. He exhibits no distension. There is no tenderness.  Musculoskeletal: Normal range of motion. He exhibits no edema or tenderness.  CTLS spine, non tender, aligned, no step off.   Neurological: He is alert and oriented to person, place, and time.  Steady gait.   Skin: Skin is  warm and dry. No rash noted. He is not diaphoretic.  Psychiatric:  Anxious appearing.  Depressed mood. +SI.  Nursing note and vitals reviewed.   ED Course  Procedures (including critical care time) Labs Review  Results for orders placed or performed during the hospital encounter of 10/05/15  CBC  Result Value Ref Range   WBC 14.3 (H) 4.0 -  10.5 K/uL   RBC 5.37 4.22 - 5.81 MIL/uL   Hemoglobin 17.5 (H) 13.0 - 17.0 g/dL   HCT 16.1 09.6 - 04.5 %   MCV 89.9 78.0 - 100.0 fL   MCH 32.6 26.0 - 34.0 pg   MCHC 36.2 (H) 30.0 - 36.0 g/dL   RDW 40.9 81.1 - 91.4 %   Platelets 290 150 - 400 K/uL  Comprehensive metabolic panel  Result Value Ref Range   Sodium 135 135 - 145 mmol/L   Potassium 3.7 3.5 - 5.1 mmol/L   Chloride 102 101 - 111 mmol/L   CO2 21 (L) 22 - 32 mmol/L   Glucose, Bld 114 (H) 65 - 99 mg/dL   BUN 11 6 - 20 mg/dL   Creatinine, Ser 7.82 0.61 - 1.24 mg/dL   Calcium 9.7 8.9 - 95.6 mg/dL   Total Protein 8.5 (H) 6.5 - 8.1 g/dL   Albumin 4.9 3.5 - 5.0 g/dL   AST 21 15 - 41 U/L   ALT 19 17 - 63 U/L   Alkaline Phosphatase 89 38 - 126 U/L   Total Bilirubin 0.6 0.3 - 1.2 mg/dL   GFR calc non Af Amer >60 >60 mL/min   GFR calc Af Amer >60 >60 mL/min   Anion gap 12 5 - 15  Troponin I  Result Value Ref Range   Troponin I <0.03 <0.031 ng/mL  Ethanol  Result Value Ref Range   Alcohol, Ethyl (B) <5 <5 mg/dL   Dg Chest 2 View  09/30/863  CLINICAL DATA:  55 year old male with chest pain and shortness of breath EXAM: CHEST  2 VIEW COMPARISON:  None. FINDINGS: Two views of the chest demonstrate mild emphysematous changes of the lungs. There is no focal consolidation, pleural effusion, or pneumothorax. The cardiac silhouette is within normal limits. No acute osseous pathology identified. IMPRESSION: No active cardiopulmonary disease. Electronically Signed   By: Elgie Collard M.D.   On: 10/05/2015 18:08       I have personally reviewed and evaluated these images and lab results as part of my medical decision-making.   EKG Interpretation   Date/Time:  Saturday October 05 2015 16:19:45 EST Ventricular Rate:  69 PR Interval:  116 QRS Duration: 100 QT Interval:  366 QTC Calculation: 392 R Axis:   88 Text Interpretation:  Normal sinus rhythm with sinus arrhythmia Incomplete  right bundle branch block No significant  change since last tracing  Confirmed by Denton Lank  MD, Caryn Bee ([id number]) on 10/05/2015 4:47:04 PM      MDM   Labs.  Reviewed nursing notes and prior charts for additional history.   Pt give motrin and hydrocodone for pain.  Behavioral health team consulted.  Psych team has assessed and indicates recommend d/c with outpatient follow up, they indicate they will [facility] and provide other outpatient referrals.  [facility] note below:  Patient Information    Patient Name Sex DOB SSN   Kevin, Jordan Male [birth year] [id number]    [facility] Assessment by Jarrett Ables, COUNS at 10/05/2015 6:24 PM    Author: Jarrett Ables, COUNS Service: (none)  Author Type: Counselor   Filed: 10/05/2015 6:25 PM Note Time: 10/05/2015 6:24 PM Status: Signed   Editor: Lanice Shirts [place], COUNS (Counselor)      Per Assunta Found, NP pt does not meet inpatient criteria and will be referred to [facility] [place] for outpatient care. Dr. Denton Lank notified.         Pt agreeable w plan.  Pt indicates he does not feel he is threat to self, and is optimistic about following up with resources provided.  Pt denies current thoughts or desire to harm self.  No cp currently. After days of symptoms, trop neg. cxr neg.  Will rec antihis/decon re sinus congestion. No fevers, or indication need abx therapy currently.  For back pain rec nsaids.   rec close pcp f/u. Resource guide provided.  Return precautions.   Pt currently appears stable for d/c.         Cathren Laine, MD 10/05/15 [phone]

## 2015-10-05 NOTE — ED Notes (Signed)
Patient now adds that he is having left sided chest pain for the last hour, denies radiation and associated symptoms.

## 2015-10-05 NOTE — ED Notes (Signed)
Talking with the patient more in depth he says he would like the opportunity to talk with a mental health provider. He says since his wife passed away he "has been letting myself go", not eating, insomnia. States he has multiple stressors right now including insurance and "things I have to do since my wife died like papers and forms". States he would be willing to go to an inpatient treatment if the mental health physicians thought it necessary.

## 2015-10-05 NOTE — ED Notes (Signed)
Pt alert & oriented x4, stable gait. Patient given discharge instructions, paperwork & prescription(s). Patient  instructed to stop at the registration desk to finish any additional paperwork. Patient verbalized understanding. Pt left department w/ no further questions. 

## 2015-10-05 NOTE — BH Assessment (Signed)
Per Assunta Found, NP pt does not meet inpatient criteria and will be referred to [facility] in [place] for outpatient care. Dr. Denton Lank notified.

## 2015-12-03 ENCOUNTER — Ambulatory Visit: Payer: Self-pay | Admitting: Physician Assistant

## 2015-12-03 ENCOUNTER — Encounter: Payer: Self-pay | Admitting: Physician Assistant

## 2015-12-03 VITALS — BP 116/70 | HR 99 | Temp 98.1°F | Ht 68.25 in | Wt 136.3 lb

## 2015-12-03 DIAGNOSIS — Z125 Encounter for screening for malignant neoplasm of prostate: Secondary | ICD-10-CM

## 2015-12-03 DIAGNOSIS — I10 Essential (primary) hypertension: Secondary | ICD-10-CM | POA: Insufficient documentation

## 2015-12-03 DIAGNOSIS — Z131 Encounter for screening for diabetes mellitus: Secondary | ICD-10-CM

## 2015-12-03 DIAGNOSIS — F17210 Nicotine dependence, cigarettes, uncomplicated: Secondary | ICD-10-CM

## 2015-12-03 DIAGNOSIS — E785 Hyperlipidemia, unspecified: Secondary | ICD-10-CM | POA: Insufficient documentation

## 2015-12-03 DIAGNOSIS — Z1211 Encounter for screening for malignant neoplasm of colon: Secondary | ICD-10-CM

## 2015-12-03 DIAGNOSIS — G8929 Other chronic pain: Secondary | ICD-10-CM

## 2015-12-03 LAB — GLUCOSE, POCT (MANUAL RESULT ENTRY): POC GLUCOSE: 113 mg/dL — AB (ref 70–99)

## 2015-12-03 MED ORDER — LISINOPRIL-HYDROCHLOROTHIAZIDE 10-12.5 MG PO TABS: 1.0000 | ORAL_TABLET | Freq: Every morning | ORAL | Status: DC

## 2015-12-03 NOTE — Progress Notes (Signed)
BP 116/70 mmHg  Pulse 99  Temp(Src) 98.1 F (36.7 C)  Ht 5' 8.25" (1.734 m)  Wt 136 lb 4.8 oz (61.825 kg)  BMI 20.56 kg/m2  SpO2 98%   Subjective:    Patient ID: Kevin Jordan, male    DOB: June 08, 1961, 55 y.o.   MRN: [id number]  HPI: Kevin Jordan is a 55 y.o. male presenting on 12/03/2015 for New Patient (Initial Visit)   HPI   Pt was going to [facility] but wants to stop going there b/c it cost him $25.    Pt is no longer going to [facility].  He says he doesn't need that any longer- he is doing okay coping with his loss (wife died in 2022-09-24)  Pt states pain from old injury LLE.   Also c/o low back pain - used to go to chiropractor.   Pt doesn't work- he says he is trying to get disability b/c his back hurts.  Relevant past medical, surgical, family and social history reviewed and updated as indicated. Interim medical history since our last visit reviewed. Allergies and medications reviewed and updated.  Current outpatient prescriptions:  .  cyclobenzaprine (FLEXERIL) 10 MG tablet, Take 10 mg by mouth at bedtime., Disp: , Rfl: 5 .  lisinopril-hydrochlorothiazide (PRINZIDE,ZESTORETIC) 10-12.5 MG tablet, Take 1 tablet by mouth every morning., Disp: , Rfl: 5 .  rosuvastatin (CRESTOR) 20 MG tablet, Take 20 mg by mouth Nightly., Disp: , Rfl:   Review of Systems  Constitutional: Negative for fever, chills, diaphoresis, appetite change, fatigue and unexpected weight change.  HENT: Positive for dental problem. Negative for congestion, drooling, ear pain, facial swelling, hearing loss, mouth sores, sneezing, sore throat, trouble swallowing and voice change.   Eyes: Positive for visual disturbance. Negative for pain, discharge, redness and itching.  Respiratory: Negative for cough, choking, shortness of breath and wheezing.   Cardiovascular: Negative for chest pain, palpitations and leg swelling.  Gastrointestinal: Negative for vomiting, abdominal pain, diarrhea, constipation and blood  in stool.  Endocrine: Positive for heat intolerance. Negative for cold intolerance and polydipsia.  Genitourinary: Negative for dysuria, hematuria and decreased urine volume.  Musculoskeletal: Positive for back pain, arthralgias and gait problem.  Skin: Positive for rash.  Allergic/Immunologic: Negative for environmental allergies.  Neurological: Negative for seizures, syncope, light-headedness and headaches.  Hematological: Negative for adenopathy.  Psychiatric/Behavioral: Positive for dysphoric mood. Negative for suicidal ideas and agitation. The patient is nervous/anxious.     Per HPI unless specifically indicated above     Objective:    BP 116/70 mmHg  Pulse 99  Temp(Src) 98.1 F (36.7 C)  Ht 5' 8.25" (1.734 m)  Wt 136 lb 4.8 oz (61.825 kg)  BMI 20.56 kg/m2  SpO2 98%  Wt Readings from Last 3 Encounters:  12/03/15 136 lb 4.8 oz (61.825 kg)  10/05/15 144 lb 11.2 oz (65.635 kg)    Physical Exam  Constitutional: He is oriented to person, place, and time. He appears well-developed and well-nourished.  HENT:  Head: Normocephalic and atraumatic.  Mouth/Throat: Oropharynx is clear and moist. No oropharyngeal exudate.  Eyes: Conjunctivae and EOM are normal. Pupils are equal, round, and reactive to light.  Neck: Neck supple. No thyromegaly present.  Cardiovascular: Normal rate and regular rhythm.   Pulmonary/Chest: Effort normal and breath sounds normal. He has no wheezes. He has no rales.  Abdominal: Soft. Bowel sounds are normal. He exhibits no mass. There is no hepatosplenomegaly. There is no tenderness.  Musculoskeletal: He exhibits no edema.  Lymphadenopathy:    He has no cervical adenopathy.  Neurological: He is alert and oriented to person, place, and time.  Skin: Skin is warm and dry. No rash noted.  Small papule LLE c/w insect bite- no signs infection  Psychiatric: He has a normal mood and affect. His behavior is normal. Thought content normal.  Vitals reviewed.   L  upper eyelid lazy   Results for orders placed or performed in visit on 12/03/15  POCT Glucose (CBG)  Result Value Ref Range   POC Glucose 113 (A) 70 - 99 mg/dl      Assessment & Plan:   Encounter Diagnoses  Name Primary?  . Essential hypertension Yes  . Hyperlipidemia   . Chronic pain   . Cigarette nicotine dependence without complication   . Screening for diabetes mellitus   . Screening for prostate cancer   . Special screening for malignant neoplasms, colon     -recommend otc cortisone cream for insect bite LLE -get Baseline labs. Will call with results -Continue current rx- he says he has many months supply of crestor remaining at home -Gave ifobt -counseled on smoking cessation -f/u 3 months.  RTO sooner prn

## 2015-12-05 LAB — COMPLETE METABOLIC PANEL WITH GFR
ALBUMIN: 4.3 g/dL (ref 3.6–5.1)
ALK PHOS: 72 U/L (ref 40–115)
ALT: 13 U/L (ref 9–46)
AST: 13 U/L (ref 10–35)
BILIRUBIN TOTAL: 0.7 mg/dL (ref 0.2–1.2)
BUN: 8 mg/dL (ref 7–25)
CO2: 24 mmol/L (ref 20–31)
CREATININE: 0.7 mg/dL (ref 0.70–1.33)
Calcium: 9.3 mg/dL (ref 8.6–10.3)
Chloride: 102 mmol/L (ref 98–110)
GFR, Est African American: >89 mL/min (ref >=60)
GLUCOSE: 90 mg/dL (ref 65–99)
Potassium: 4.4 mmol/L (ref 3.5–5.3)
SODIUM: 137 mmol/L (ref 135–146)
TOTAL PROTEIN: 7.1 g/dL (ref 6.1–8.1)

## 2015-12-05 LAB — LIPID PANEL
Cholesterol: 138 mg/dL (ref 125–200)
HDL: 31 mg/dL — ABNORMAL LOW (ref >=40)
LDL CALC: 83 mg/dL (ref <130)
Total CHOL/HDL Ratio: 4.5 Ratio (ref <=5.0)
Triglycerides: 122 mg/dL (ref <150)
VLDL: 24 mg/dL (ref <30)

## 2015-12-05 LAB — HEMOGLOBIN A1C
HEMOGLOBIN A1C: 5.7 % — AB (ref <5.7)
MEAN PLASMA GLUCOSE: 117 mg/dL

## 2015-12-05 LAB — PSA: PSA: 1.02 ng/mL (ref <=4.00)

## 2015-12-08 DIAGNOSIS — F17210 Nicotine dependence, cigarettes, uncomplicated: Secondary | ICD-10-CM | POA: Insufficient documentation

## 2015-12-08 DIAGNOSIS — G8929 Other chronic pain: Secondary | ICD-10-CM | POA: Insufficient documentation

## 2015-12-09 LAB — IFOBT (OCCULT BLOOD): IFOBT: NEGATIVE

## 2016-01-14 ENCOUNTER — Ambulatory Visit: Payer: Self-pay | Admitting: Physician Assistant

## 2016-01-14 ENCOUNTER — Encounter: Payer: Self-pay | Admitting: Physician Assistant

## 2016-01-14 VITALS — BP 118/82 | HR 104 | Temp 97.9°F | Ht 68.25 in | Wt 143.8 lb

## 2016-01-14 DIAGNOSIS — L918 Other hypertrophic disorders of the skin: Secondary | ICD-10-CM

## 2016-01-14 NOTE — Progress Notes (Signed)
   BP 118/82 mmHg  Pulse 104  Temp(Src) 97.9 F (36.6 C)  Ht 5' 8.25" (1.734 m)  Wt 143 lb 12.8 oz (65.227 kg)  BMI 21.69 kg/m2  SpO2 98%   Subjective:    Patient ID: Kevin Jordan, male    DOB: Apr 18, 1961, 55 y.o.   MRN: [id number]  HPI: Kevin Jordan is a 55 y.o. male presenting on 01/14/2016 for Mass   HPI Chief Complaint  Patient presents with  . Mass    pt states it is about the size of his fingernail. on R buttuck for about 10 years. pt states it has gotten bigger and it's aggrivating     Relevant past medical, surgical, family and social history reviewed and updated as indicated. Interim medical history since our last visit reviewed. Allergies and medications reviewed and updated.  Current outpatient prescriptions:  .  cyclobenzaprine (FLEXERIL) 10 MG tablet, Take 10 mg by mouth at bedtime., Disp: , Rfl: 5 .  lisinopril-hydrochlorothiazide (PRINZIDE,ZESTORETIC) 10-12.5 MG tablet, Take 1 tablet by mouth every morning., Disp: 90 tablet, Rfl: 1 .  rosuvastatin (CRESTOR) 20 MG tablet, Take 20 mg by mouth Nightly., Disp: , Rfl:    Review of Systems  Constitutional: Negative for chills and fatigue.  HENT: Positive for dental problem. Negative for congestion and facial swelling.   Eyes: Positive for pain and discharge.  Respiratory: Negative for cough and wheezing.   Cardiovascular: Negative for chest pain and palpitations.  Gastrointestinal: Negative for vomiting, diarrhea and constipation.  Endocrine: Negative for polydipsia.  Genitourinary: Negative for dysuria and hematuria.  Musculoskeletal: Positive for back pain, arthralgias and gait problem.  Neurological: Negative for seizures, syncope and light-headedness.  Hematological: Negative for adenopathy.  Psychiatric/Behavioral: Positive for dysphoric mood. Negative for suicidal ideas and agitation. The patient is nervous/anxious.     Per HPI unless specifically indicated above     Objective:    BP 118/82  mmHg  Pulse 104  Temp(Src) 97.9 F (36.6 C)  Ht 5' 8.25" (1.734 m)  Wt 143 lb 12.8 oz (65.227 kg)  BMI 21.69 kg/m2  SpO2 98%  Wt Readings from Last 3 Encounters:  01/14/16 143 lb 12.8 oz (65.227 kg)  12/03/15 136 lb 4.8 oz (61.825 kg)  10/05/15 144 lb 11.2 oz (65.635 kg)    Physical Exam  Constitutional: He is oriented to person, place, and time. He appears well-developed and well-nourished.  Pulmonary/Chest: Effort normal.  Neurological: He is alert and oriented to person, place, and time.  Skin: Skin is warm and dry.  Large pea-sized skin tag on R buttock.  Discussed risks/benefits of removal.  Pt agreed and signed informed consent.  Area cleaned with hibiclens. Instilled 1% lidocaine. Cut of tag with #11 blade.  Applied direct pressure to obtain hemostasis.  Dry sterile dressing applied.  Pt tolerated procedure well and ambulated from room unassisted.  Psychiatric: He has a normal mood and affect. His behavior is normal. Thought content normal.  Nursing note and vitals reviewed.       Assessment & Plan:   Encounter Diagnosis  Name Primary?  . Skin tag Yes    -rx for PCN by Rich NumberKamran Hameed, [facility] -pt counsled on skin care for tag site. -f/u as scheduled.  RTO sooner prn

## 2016-02-17 ENCOUNTER — Encounter: Payer: Self-pay | Admitting: Physician Assistant

## 2016-02-17 ENCOUNTER — Ambulatory Visit: Payer: Self-pay | Admitting: Physician Assistant

## 2016-02-17 VITALS — BP 122/92 | HR 83 | Temp 98.1°F | Ht 68.25 in | Wt 145.8 lb

## 2016-02-17 DIAGNOSIS — H6123 Impacted cerumen, bilateral: Secondary | ICD-10-CM

## 2016-02-17 NOTE — Progress Notes (Signed)
   BP 122/92 mmHg  Pulse 83  Temp(Src) 98.1 F (36.7 C)  Ht 5' 8.25" (1.734 m)  Wt 145 lb 12.8 oz (66.134 kg)  BMI 22.00 kg/m2  SpO2 99%   Subjective:    Patient ID: Benita GutterMichael D Baldassari, male    DOB: 1960-12-31, 55 y.o.   MRN: [id number]  HPI: Benita GutterMichael D Quinter is a 55 y.o. male presenting on 02/17/2016 for Ear Pain   HPI   Pt states onset L ear pain this a.m.  No cough, congestion or R earache.  No ST.  Relevant past medical, surgical, family and social history reviewed and updated as indicated. Interim medical history since our last visit reviewed. Allergies and medications reviewed and updated.   Current outpatient prescriptions:  .  cyclobenzaprine (FLEXERIL) 10 MG tablet, Take 10 mg by mouth at bedtime., Disp: , Rfl: 5 .  lisinopril-hydrochlorothiazide (PRINZIDE,ZESTORETIC) 10-12.5 MG tablet, Take 1 tablet by mouth every morning., Disp: 90 tablet, Rfl: 1 .  rosuvastatin (CRESTOR) 20 MG tablet, Take 20 mg by mouth Nightly., Disp: , Rfl:    Review of Systems  Constitutional: Negative for fever, chills, diaphoresis, appetite change, fatigue and unexpected weight change.  HENT: Positive for ear pain. Negative for congestion, dental problem, drooling, ear discharge, facial swelling, hearing loss, mouth sores, nosebleeds, postnasal drip, rhinorrhea, sinus pressure, sneezing, sore throat and tinnitus.   Eyes: Negative for pain, discharge, redness and itching.  Respiratory: Negative for cough, shortness of breath and wheezing.   Cardiovascular: Negative for chest pain.    Per HPI unless specifically indicated above     Objective:    BP 122/92 mmHg  Pulse 83  Temp(Src) 98.1 F (36.7 C)  Ht 5' 8.25" (1.734 m)  Wt 145 lb 12.8 oz (66.134 kg)  BMI 22.00 kg/m2  SpO2 99%  Wt Readings from Last 3 Encounters:  02/17/16 145 lb 12.8 oz (66.134 kg)  01/14/16 143 lb 12.8 oz (65.227 kg)  12/03/15 136 lb 4.8 oz (61.825 kg)    Physical Exam  Constitutional: He is oriented to person,  place, and time. He appears well-developed and well-nourished.  HENT:  Head: Normocephalic and atraumatic.  Right Ear: Hearing, tympanic membrane and external ear normal. A foreign body is present.  Left Ear: Hearing, tympanic membrane and external ear normal. A foreign body is present.  Nose: Nose normal.  Mouth/Throat: Uvula is midline and oropharynx is clear and moist. No uvula swelling. No oropharyngeal exudate, posterior oropharyngeal edema, posterior oropharyngeal erythema or tonsillar abscesses.  B TM obscured by cerumen. After lavage, TM normal bilaterally  Neck: Neck supple.  Cardiovascular: Normal rate and regular rhythm.   Pulmonary/Chest: Effort normal and breath sounds normal.  Lymphadenopathy:    He has no cervical adenopathy.  Neurological: He is alert and oriented to person, place, and time.  Skin: Skin is warm and dry.  Psychiatric: He has a normal mood and affect. His behavior is normal.  Vitals reviewed.       Assessment & Plan:    Encounter Diagnosis  Name Primary?  . Cerumen impaction, bilateral Yes   F/u as scheduled.  RTO sooner prn

## 2016-03-03 ENCOUNTER — Encounter: Payer: Self-pay | Admitting: Physician Assistant

## 2016-03-03 ENCOUNTER — Ambulatory Visit: Payer: Self-pay | Admitting: Physician Assistant

## 2016-03-03 VITALS — BP 102/66 | HR 74 | Temp 97.9°F | Ht 68.25 in | Wt 149.5 lb

## 2016-03-03 DIAGNOSIS — F17210 Nicotine dependence, cigarettes, uncomplicated: Secondary | ICD-10-CM

## 2016-03-03 DIAGNOSIS — I10 Essential (primary) hypertension: Secondary | ICD-10-CM

## 2016-03-03 DIAGNOSIS — G8929 Other chronic pain: Secondary | ICD-10-CM

## 2016-03-03 DIAGNOSIS — E785 Hyperlipidemia, unspecified: Secondary | ICD-10-CM

## 2016-03-03 MED ORDER — LISINOPRIL-HYDROCHLOROTHIAZIDE 10-12.5 MG PO TABS: 1.0000 | ORAL_TABLET | Freq: Every day | ORAL | Status: AC

## 2016-03-03 MED ORDER — CYCLOBENZAPRINE HCL 10 MG PO TABS: 10.0000 mg | ORAL_TABLET | Freq: Every day | ORAL | Status: AC

## 2016-03-03 NOTE — Progress Notes (Signed)
BP 102/66 mmHg  Pulse 74  Temp(Src) 97.9 F (36.6 C)  Ht 5' 8.25" (1.734 m)  Wt 149 lb 8 oz (67.813 kg)  BMI 22.55 kg/m2  SpO2 97%   Subjective:    Patient ID: Kevin Jordan, male    DOB: 07/30/1961, 55 y.o.   MRN: [id number]  HPI: Kevin Jordan is a 55 y.o. male presenting on 03/03/2016 for Hypertension and Hyperlipidemia   HPI   Pt still taking crestor he had left.  He has enough for maybe 6 wk or so.  Pt wants to see if he can quit taking it when he runs out- he doesn't want to just change to different rx.  Pt states he has chronic pain.  [facility] so flex[facility]il daily isn't going to happen here.  Will be willing to write him some to use prn- no more than 30 tabs in 90 days.  Pt states understanding and agrees.  Pt only gets sob when he's out in the heat.  He denies DOE when he isn't in the heat  Relevant past medical, surgical, family and social history reviewed and updated as indicated. Interim medical history since our last visit reviewed. Allergies and medications reviewed and updated.  Current outpatient prescriptions:  .  cyclobenzaprine (FLEXERIL) 10 MG tablet, Take 10 mg by mouth at bedtime., Disp: , Rfl: 5 .  lisinopril-hydrochlorothiazide (PRINZIDE,ZESTORETIC) 10-12.5 MG tablet, Take 1 tablet by mouth every morning., Disp: 90 tablet, Rfl: 1 .  rosuvastatin (CRESTOR) 20 MG tablet, Take 20 mg by mouth Nightly., Disp: , Rfl:   Review of Systems  Constitutional: Negative for fever, chills, diaphoresis, appetite change, fatigue and unexpected weight change.  HENT: Positive for dental problem. Negative for congestion, drooling, ear pain, facial swelling, hearing loss, mouth sores, sneezing, sore throat, trouble swallowing and voice change.   Eyes: Negative for pain, discharge, redness, itching and visual disturbance.  Respiratory: Positive for shortness of breath. Negative for cough, choking and wheezing.   Cardiovascular: Negative  for chest pain, palpitations and leg swelling.  Gastrointestinal: Negative for vomiting, abdominal pain, diarrhea, constipation and blood in stool.  Endocrine: Positive for cold intolerance and heat intolerance. Negative for polydipsia.  Genitourinary: Negative for dysuria, hematuria and decreased urine volume.  Musculoskeletal: Positive for back pain, arthralgias and gait problem.  Skin: Negative for rash.  Allergic/Immunologic: Positive for environmental allergies.  Neurological: Negative for seizures, syncope, light-headedness and headaches.  Hematological: Negative for adenopathy.  Psychiatric/Behavioral: Positive for dysphoric mood. Negative for suicidal ideas and agitation. The patient is nervous/anxious.     Per HPI unless specifically indicated above     Objective:    BP 102/66 mmHg  Pulse 74  Temp(Src) 97.9 F (36.6 C)  Ht 5' 8.25" (1.734 m)  Wt 149 lb 8 oz (67.813 kg)  BMI 22.55 kg/m2  SpO2 97%  Wt Readings from Last 3 Encounters:  03/03/16 149 lb 8 oz (67.813 kg)  02/17/16 145 lb 12.8 oz (66.134 kg)  01/14/16 143 lb 12.8 oz (65.227 kg)    Physical Exam  Constitutional: He is oriented to person, place, and time. He appears well-developed and well-nourished.  HENT:  Head: Normocephalic and atraumatic.  Neck: Neck supple.  Cardiovascular: Normal rate and regular rhythm.   Pulmonary/Chest: Effort normal and breath sounds normal. He has no wheezes.  Abdominal: Soft. Bowel sounds are normal. There is no hepatosplenomegaly. There is no tenderness.  Musculoskeletal: He exhibits no edema.  Lymphadenopathy:  He has no cervical adenopathy.  Neurological: He is alert and oriented to person, place, and time.  Skin: Skin is warm and dry.  Psychiatric: He has a normal mood and affect. His behavior is normal.  Vitals reviewed.       Assessment & Plan:   Encounter Diagnoses  Name Primary?  . Essential hypertension Yes  . Hyperlipidemia   . Chronic pain   .  Cigarette nicotine dependence without complication      -Will check labs with lipids before next OV -counseled smoking cessation -continue lisinopril/hctz for bp -counseled on back exercises to help his chronic pain -f/u 4 months (long enough for him to finish his crestor and be off it prior to labs)

## 2016-03-03 NOTE — Patient Instructions (Signed)

## 2016-06-22 ENCOUNTER — Other Ambulatory Visit: Payer: Self-pay

## 2016-06-22 DIAGNOSIS — E785 Hyperlipidemia, unspecified: Secondary | ICD-10-CM

## 2016-06-22 DIAGNOSIS — I10 Essential (primary) hypertension: Secondary | ICD-10-CM

## 2016-07-02 ENCOUNTER — Ambulatory Visit: Payer: Self-pay | Admitting: Physician Assistant

## 2016-07-13 ENCOUNTER — Encounter: Payer: Self-pay | Admitting: Physician Assistant

## 2017-11-26 IMAGING — DX DG CHEST 2V
2 series · 2 of 2 positions shown · non-contrast
Comparison: None.

CLINICAL DATA: 54-year-old male with chest pain and shortness of
breath

EXAM:
CHEST  2 VIEW

[chest pa]
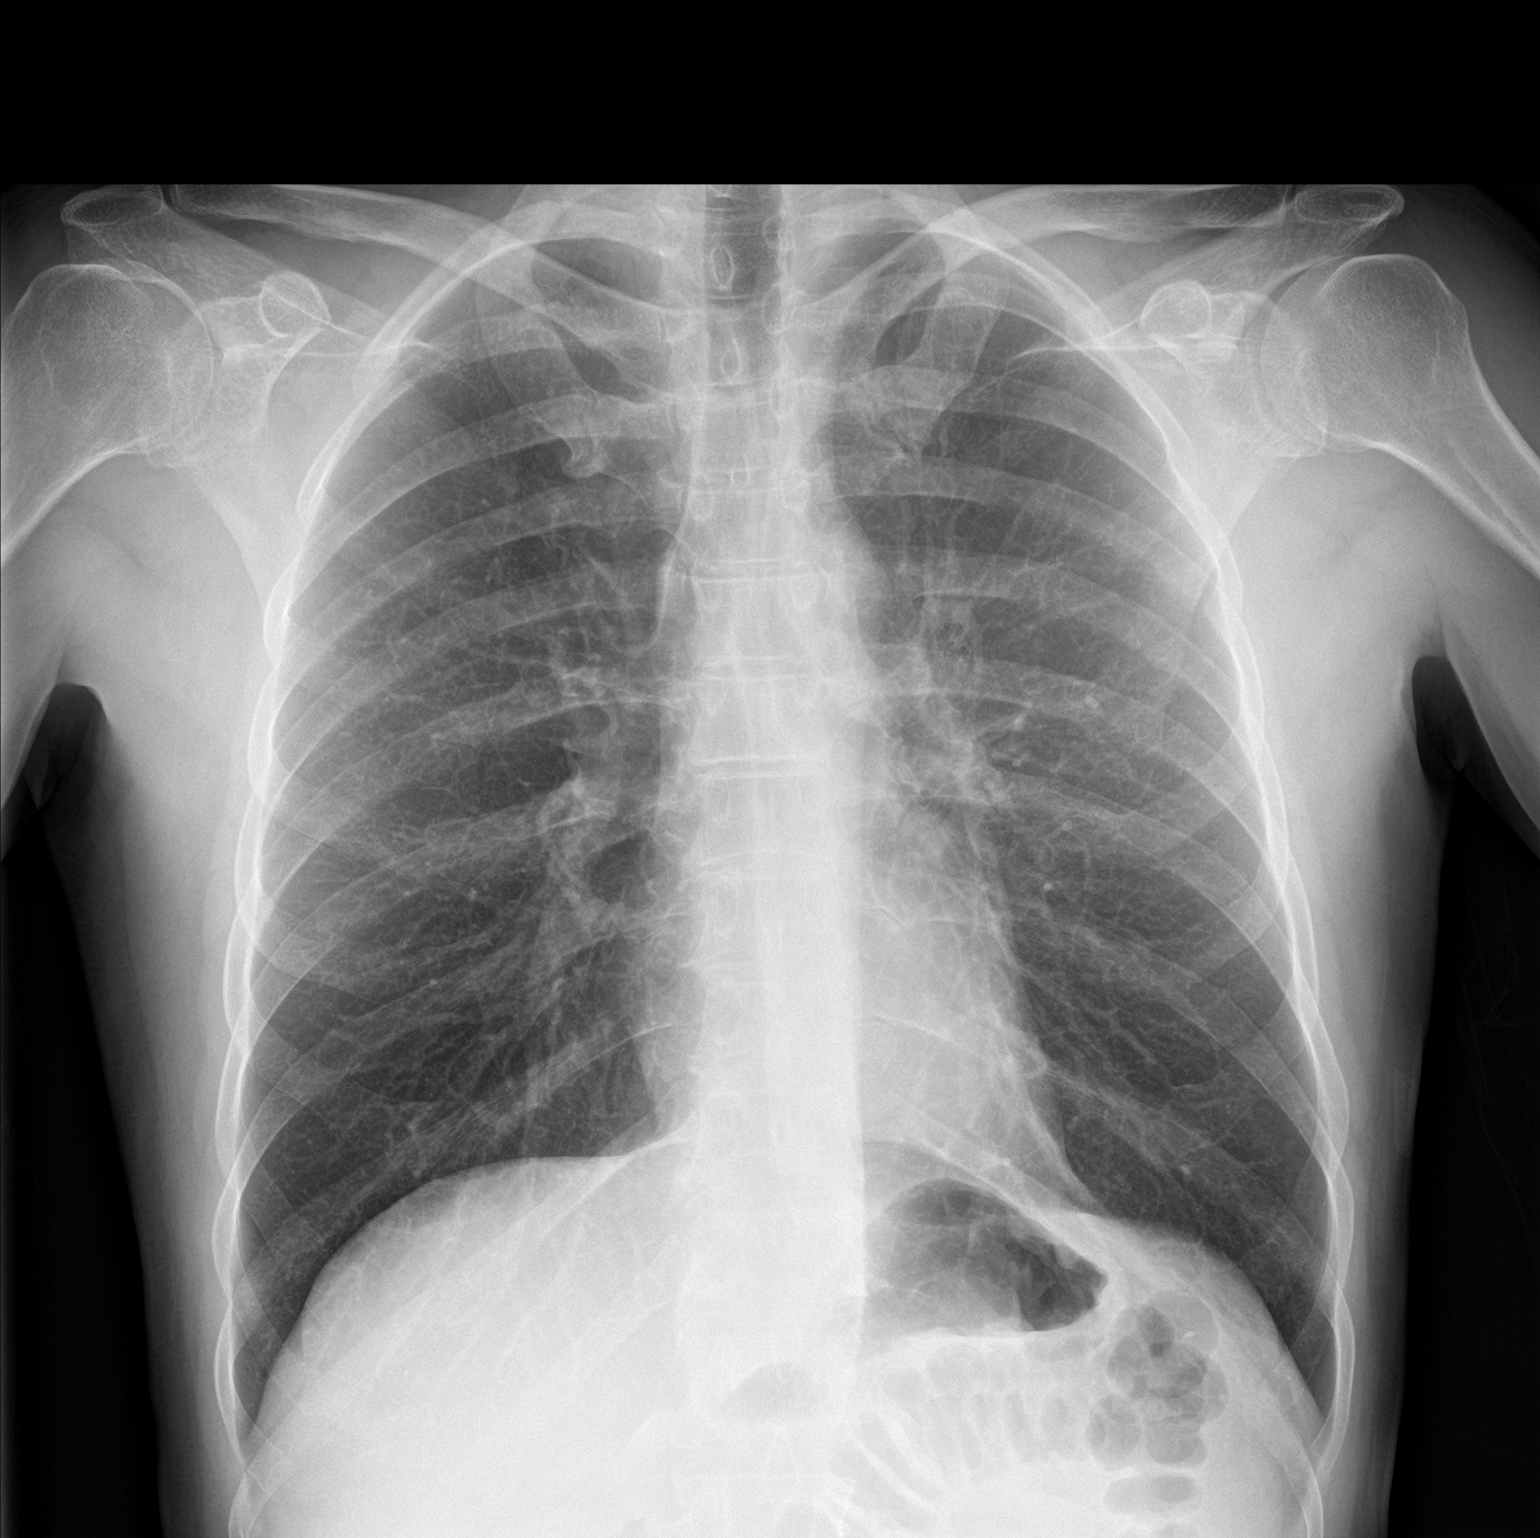

[chest lat]
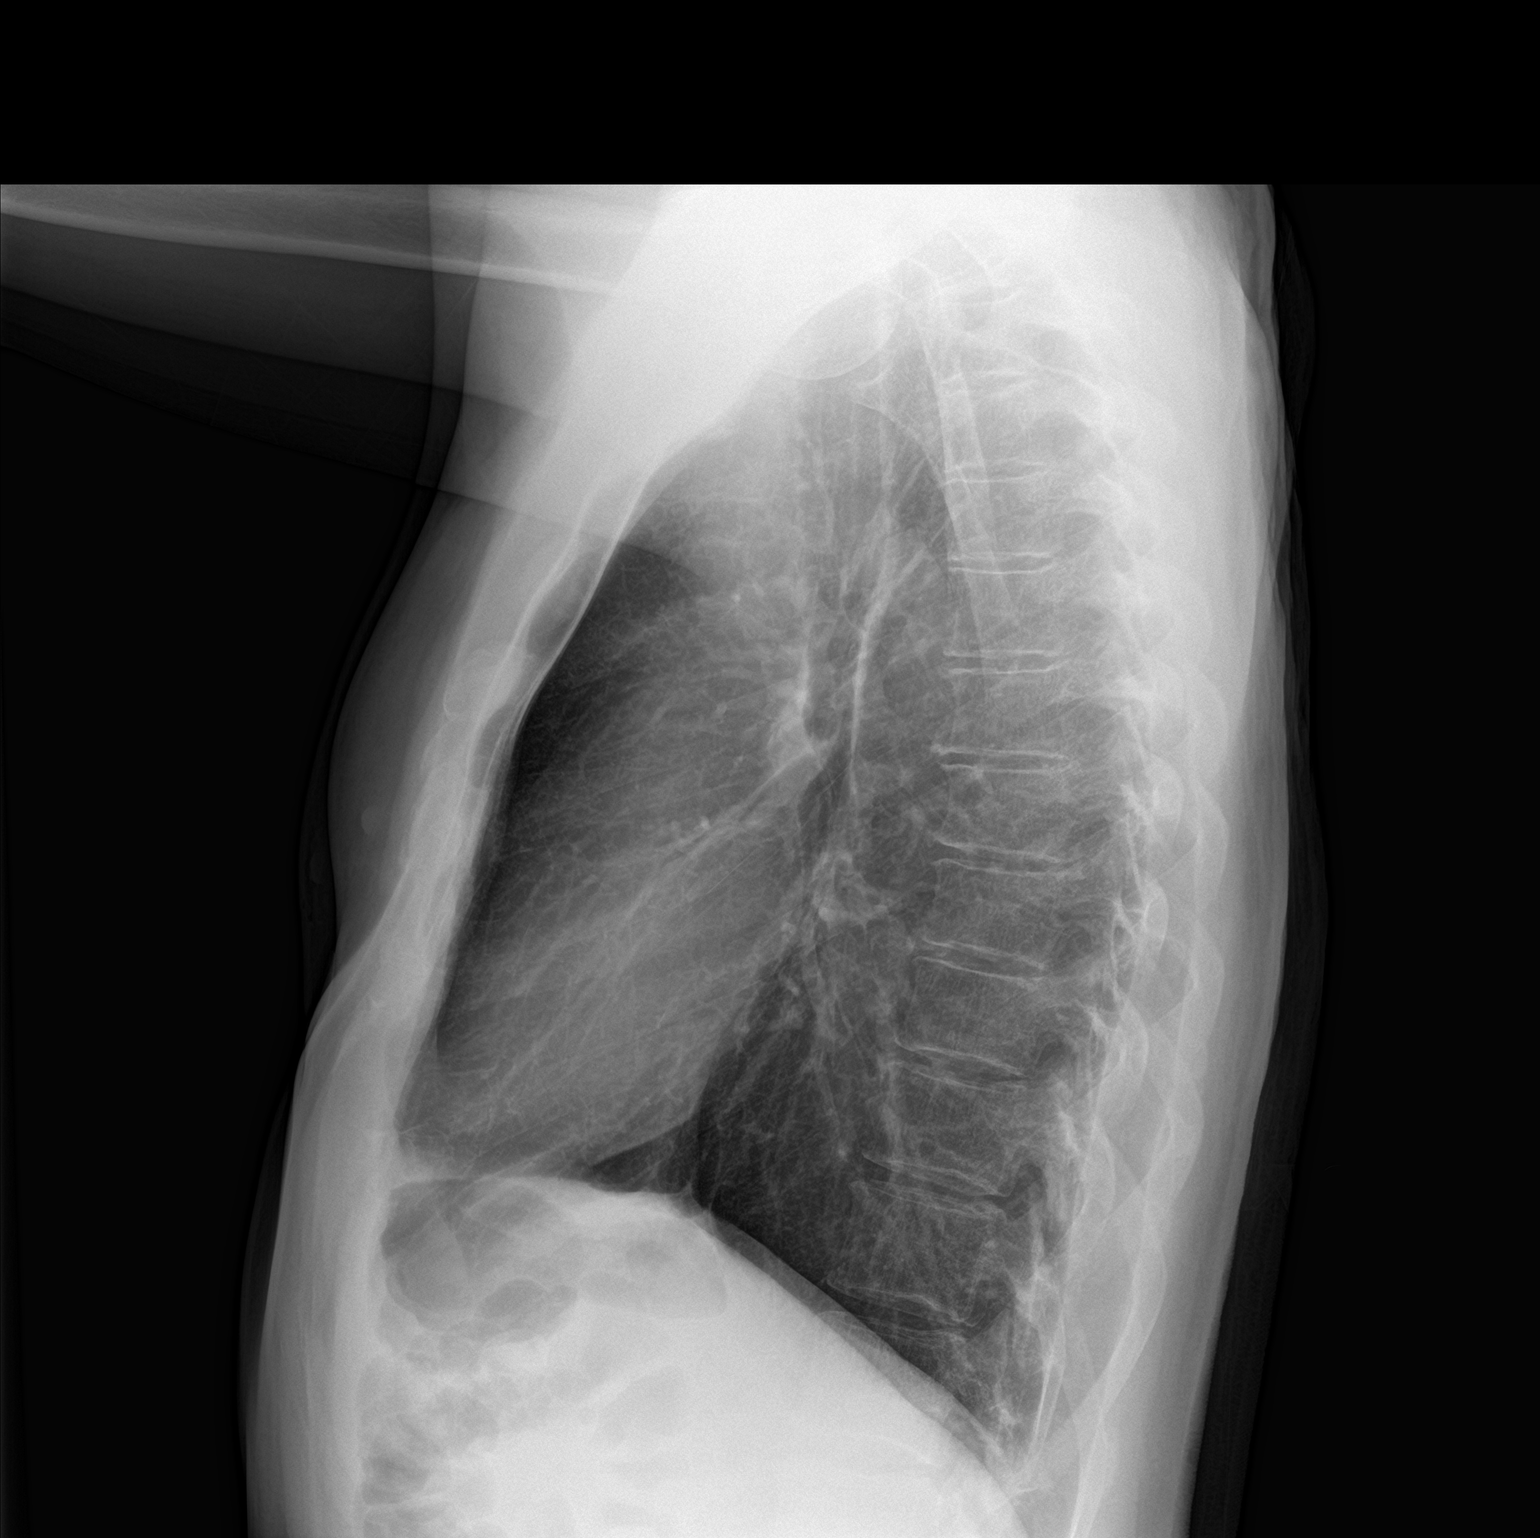

[2 of 2 positions shown; findings below may reference images not displayed]

FINDINGS: Two views of the chest demonstrate mild emphysematous changes of the
lungs. There is no focal consolidation, pleural effusion, or
pneumothorax. The cardiac silhouette is within normal limits. No
acute osseous pathology identified.
IMPRESSION: No active cardiopulmonary disease.
